# Patient Record
Sex: Male | Born: 1960 | Race: White | Hispanic: No | Smoking: Former smoker
Health system: Southern US, Community
[De-identification: ages and names within clinical notes are randomized; demographics above are authoritative.]

## PROBLEM LIST (undated history)

## (undated) DIAGNOSIS — K219 Gastro-esophageal reflux disease without esophagitis: Secondary | ICD-10-CM

## (undated) DIAGNOSIS — A09 Infectious gastroenteritis and colitis, unspecified: Secondary | ICD-10-CM

## (undated) HISTORY — DX: Gastro-esophageal reflux disease without esophagitis: K21.9

---

## 2008-10-22 ENCOUNTER — Emergency Department (HOSPITAL_COMMUNITY): Admission: EM | Admit: 2008-10-22 | Discharge: 2008-10-23 | Payer: Self-pay | Admitting: Emergency Medicine

## 2010-04-21 LAB — URINALYSIS, ROUTINE W REFLEX MICROSCOPIC
Ketones, ur: 15 mg/dL — AB
Nitrite: NEGATIVE
Protein, ur: NEGATIVE mg/dL

## 2010-04-21 LAB — COMPREHENSIVE METABOLIC PANEL
ALT: 171 U/L — ABNORMAL HIGH (ref 0–53)
Albumin: 3.2 g/dL — ABNORMAL LOW (ref 3.5–5.2)
Alkaline Phosphatase: 283 U/L — ABNORMAL HIGH (ref 39–117)
BUN: 10 mg/dL (ref 6–23)
CO2: 25 mEq/L (ref 19–32)
Calcium: 8.6 mg/dL (ref 8.4–10.5)
Chloride: 101 mEq/L (ref 96–112)
Creatinine, Ser: 1.1 mg/dL (ref 0.4–1.5)
GFR calc Af Amer: >60 mL/min (ref >60)
Potassium: 4 mEq/L (ref 3.5–5.1)
Sodium: 133 mEq/L — ABNORMAL LOW (ref 135–145)

## 2010-04-21 LAB — CBC
HCT: 36 % — ABNORMAL LOW (ref 39.0–52.0)
Hemoglobin: 12.8 g/dL — ABNORMAL LOW (ref 13.0–17.0)
MCV: 92.9 fL (ref 78.0–100.0)
Platelets: 152 10*3/uL (ref 150–400)
RDW: 12.6 % (ref 11.5–15.5)

## 2010-04-21 LAB — DIFFERENTIAL
Basophils Absolute: 0 10*3/uL (ref 0.0–0.1)
Basophils Relative: 0 % (ref 0–1)
Lymphocytes Relative: 8 % — ABNORMAL LOW (ref 12–46)
Monocytes Absolute: 0.7 10*3/uL (ref 0.1–1.0)
Neutrophils Relative %: 81 % — ABNORMAL HIGH (ref 43–77)

## 2010-04-21 LAB — LIPASE, BLOOD: Lipase: 61 U/L — ABNORMAL HIGH (ref 11–59)

## 2010-11-25 IMAGING — CT CT ABDOMEN W/ CM
2 of 5 series · 17 of 46 positions shown, 19 images · IV contrast (APPLIED)
Comparison: None available.

 CT ABDOMEN

10/23/2008 - DUPLICATE COPY for exam association in [facility] – No change from original report.
CLINICAL DATA: Epigastric pain, nausea and vomiting.

 CT ABDOMEN AND PELVIS WITH CONTRAST
TECHNIQUE: Multidetector CT imaging of the abdomen and pelvis was
 performed using the standard protocol following bolus
 administration of intravenous contrast.
 Contrast: 80 ml Smnipaque-BUU.

[Series 2: abd/pelv with 5.0 b31f st · axial · 0.64mm/px · z∈[+722,+1102]mm · 14 of 86 slices shown, 16 images]
[im 5/86  soft-tissue]
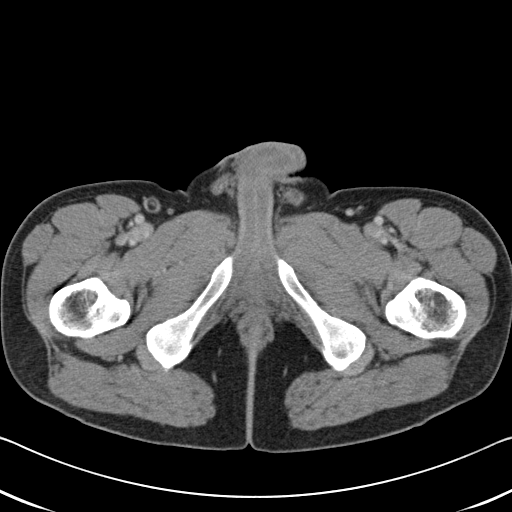
[im 5/86  bone]
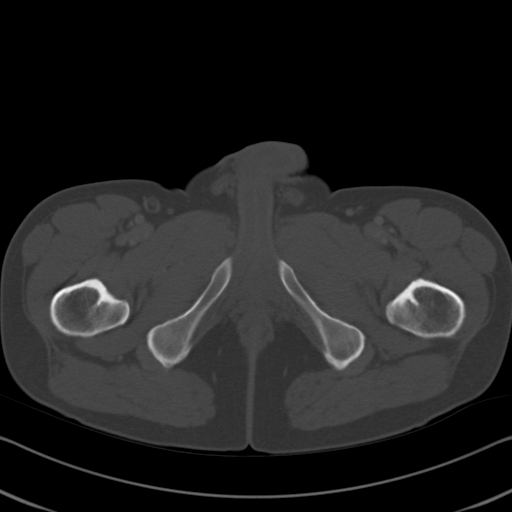
[im 10/86  soft-tissue]
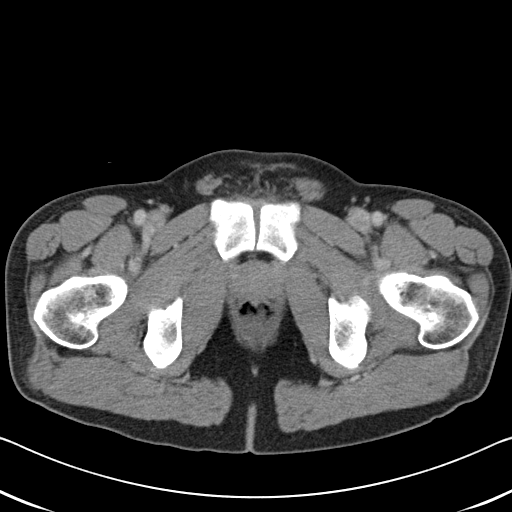
[im 19/86  soft-tissue]
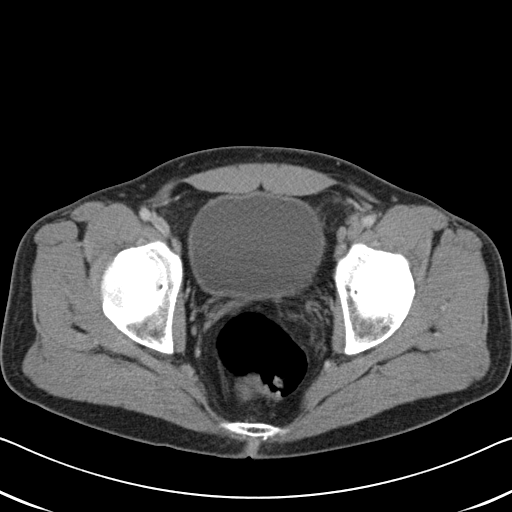
[im 24/86  soft-tissue]
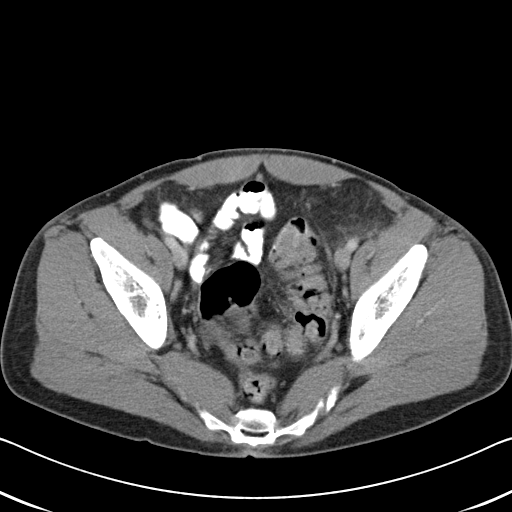
[im 29/86  soft-tissue]
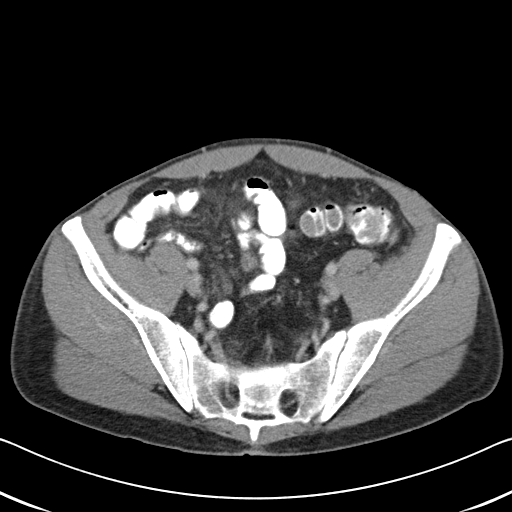
[im 34/86  soft-tissue]
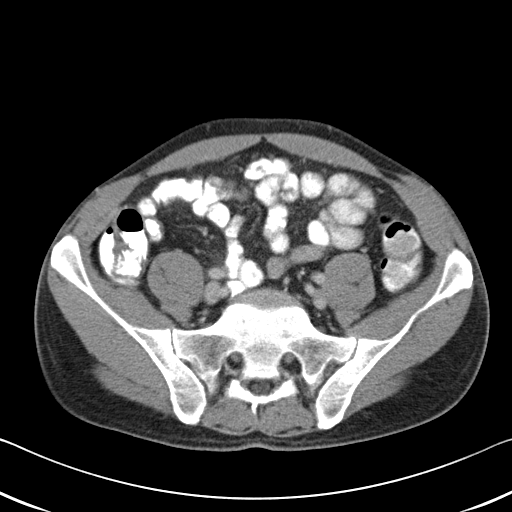
[im 38/86  soft-tissue]
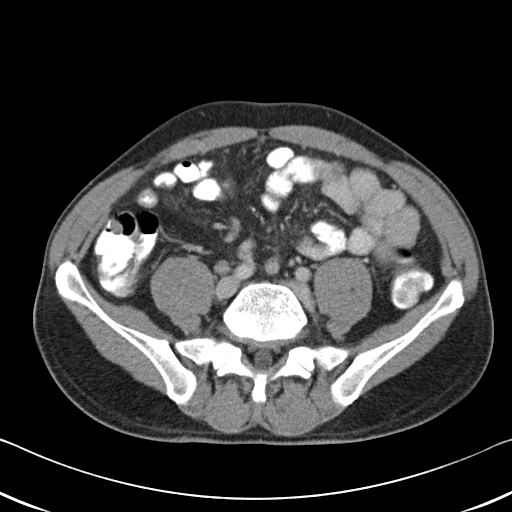
[im 48/86  soft-tissue]
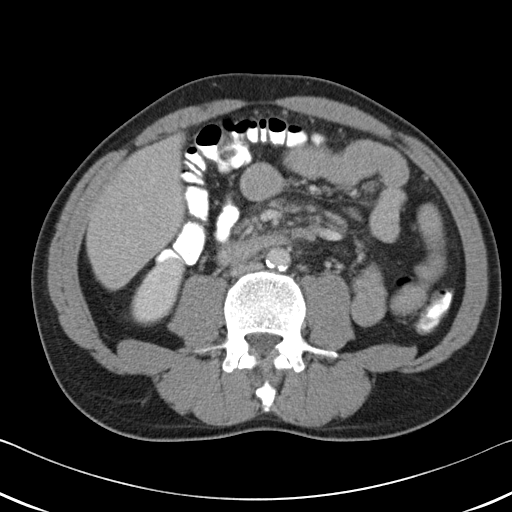
[im 52/86  soft-tissue]
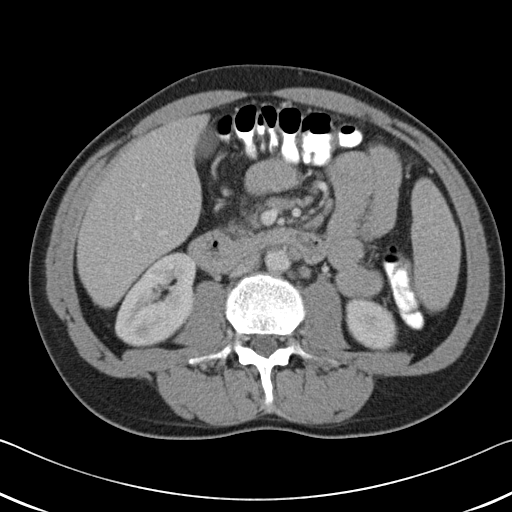
[im 52/86  bone]
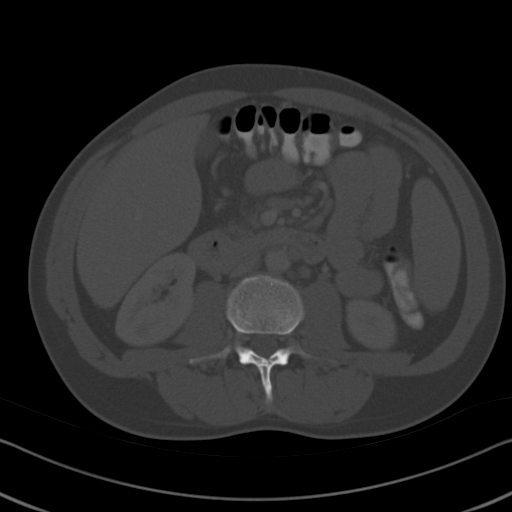
[im 57/86  soft-tissue]
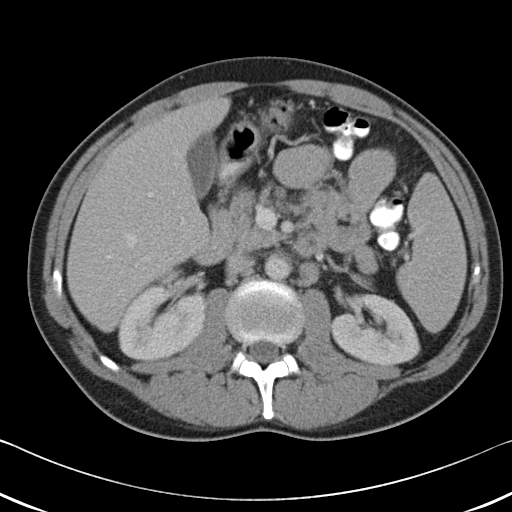
[im 62/86  soft-tissue]
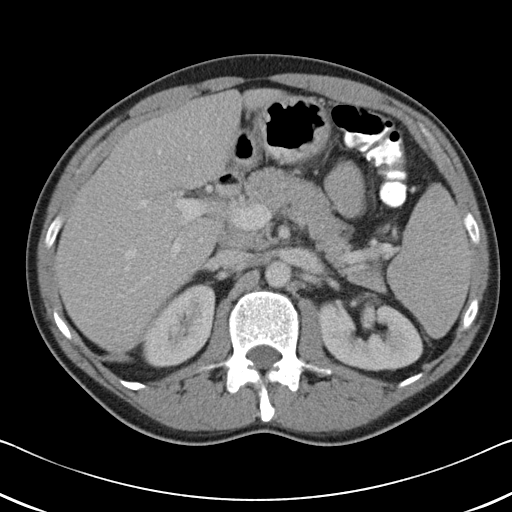
[im 67/86  soft-tissue]
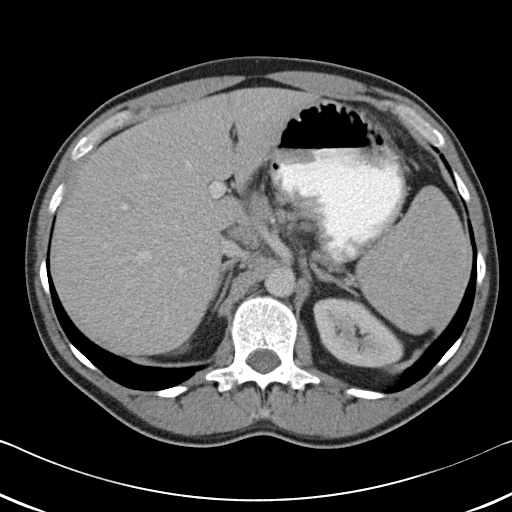
[im 76/86  soft-tissue]
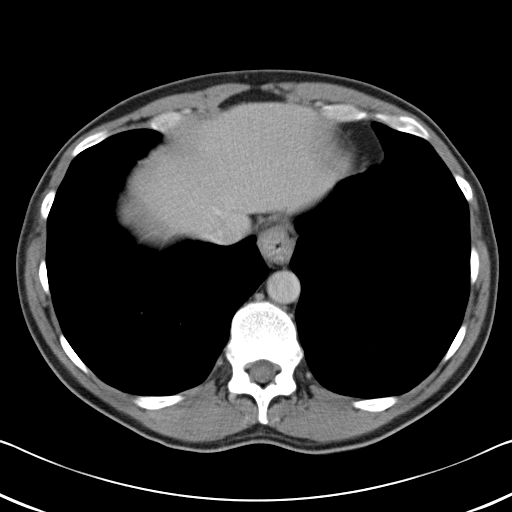
[im 81/86  soft-tissue]
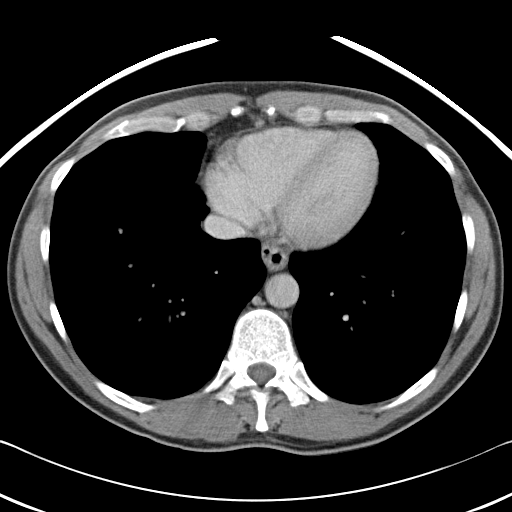

[Series 5: abd/pelv with 2.0 spo cor st · coronal · 0.85mm/px · 3 of 125 slices shown]
[im 42/125  soft-tissue]
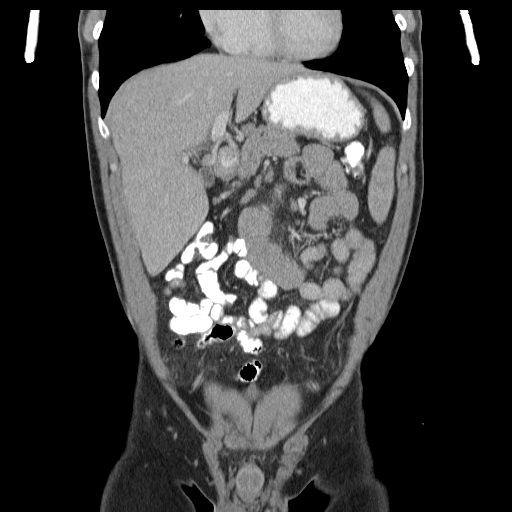
[im 56/125  soft-tissue]
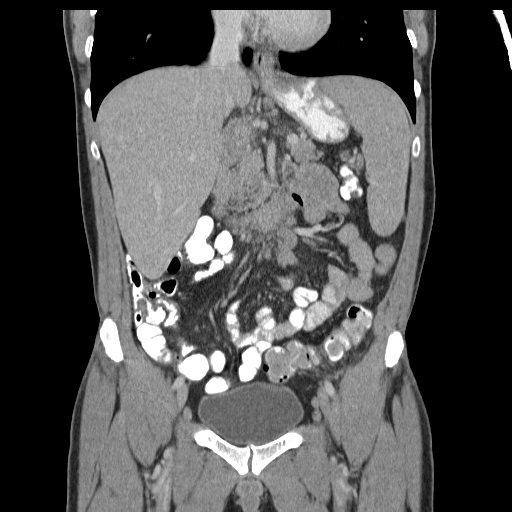
[im 69/125  soft-tissue]
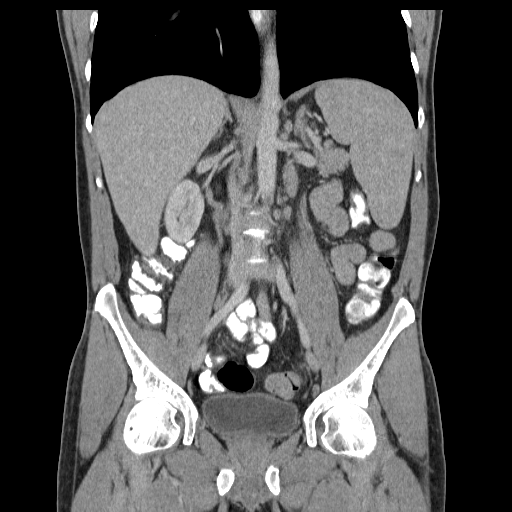

[17 of 46 positions shown; findings below may reference images not displayed]

FINDINGS: Lung base are clear. No pleural or pericardial
 effusion. Small hiatal hernia.

 The patient has retroperitoneal lymphadenopathy with a left
 periaortic node on image 30 measuring 1.2 cm. Gastrohepatic
 ligament lymph node on image 21 measures 1.4 cm. There is an
 abnormal number of lymph nodes also seen the root the mesentery
 with some haziness present. Index node on image 35 measures 0.7 cm
 short axis dimension. The gallbladder, liver, adrenal glands and
 kidneys appear normal. There is no focal splenic lesion. Splenic
 volume is 490 cm compatible with mild splenomegaly. No focal fluid
 collection. No focal bony abnormality.
IMPRESSION: 1. Abdominal lymphadenopathy and mild splenomegaly is nonspecific
 but could be due to lymphoproliferative disease/lymphoma.
 2. Small hiatal hernia.

 CT PELVIS
FINDINGS: There is some infiltration of fat about the sigmoid
 colon at its junction with the descending colon worrisome for
 diverticulitis. No abscess or perforation. There is a small
 volume of free pelvic fluid. The appendix is unremarkable.
 Urinary bladder, seminal vesicles and prostate gland appear normal.
 No focal bony abnormality.
IMPRESSION: 1. Infiltration of fat about the sigmoid colon at its junction
 with the descending colon is worrisome for diverticulitis. No
 abscess or perforation.
 2. Tiny amount of free pelvic fluid.

## 2011-01-26 ENCOUNTER — Encounter (HOSPITAL_COMMUNITY): Payer: Self-pay | Admitting: *Deleted

## 2011-01-26 ENCOUNTER — Emergency Department (HOSPITAL_COMMUNITY): Payer: Self-pay

## 2011-01-26 ENCOUNTER — Emergency Department (HOSPITAL_COMMUNITY)
Admission: EM | Admit: 2011-01-26 | Discharge: 2011-01-26 | Disposition: A | Discharge status: Home / Self Care | Payer: Self-pay | Attending: Cardiovascular Disease | Admitting: Cardiovascular Disease

## 2011-01-26 ENCOUNTER — Other Ambulatory Visit: Payer: Self-pay

## 2011-01-26 DIAGNOSIS — R0789 Other chest pain: Secondary | ICD-10-CM | POA: Insufficient documentation

## 2011-01-26 DIAGNOSIS — F17200 Nicotine dependence, unspecified, uncomplicated: Secondary | ICD-10-CM | POA: Insufficient documentation

## 2011-01-26 DIAGNOSIS — M25519 Pain in unspecified shoulder: Secondary | ICD-10-CM | POA: Insufficient documentation

## 2011-01-26 DIAGNOSIS — R079 Chest pain, unspecified: Secondary | ICD-10-CM

## 2011-01-26 DIAGNOSIS — M542 Cervicalgia: Secondary | ICD-10-CM | POA: Insufficient documentation

## 2011-01-26 HISTORY — DX: Infectious gastroenteritis and colitis, unspecified: A09

## 2011-01-26 LAB — CBC
MCV: 90.9 fL (ref 78.0–100.0)
Platelets: 207 10*3/uL (ref 150–400)
RBC: 4.61 MIL/uL (ref 4.22–5.81)
WBC: 12.3 10*3/uL — ABNORMAL HIGH (ref 4.0–10.5)

## 2011-01-26 LAB — DIFFERENTIAL
Basophils Absolute: 0 10*3/uL (ref 0.0–0.1)
Eosinophils Absolute: 0.4 10*3/uL (ref 0.0–0.7)
Eosinophils Relative: 3 % (ref 0–5)
Monocytes Absolute: 1.2 10*3/uL — ABNORMAL HIGH (ref 0.1–1.0)
Monocytes Relative: 10 % (ref 3–12)
Neutrophils Relative %: 72 % (ref 43–77)

## 2011-01-26 LAB — COMPREHENSIVE METABOLIC PANEL
ALT: 21 U/L (ref 0–53)
AST: 20 U/L (ref 0–37)
Alkaline Phosphatase: 68 U/L (ref 39–117)
BUN: 23 mg/dL (ref 6–23)
CO2: 26 mEq/L (ref 19–32)
GFR calc Af Amer: 74 mL/min — ABNORMAL LOW (ref >90)
GFR calc non Af Amer: 64 mL/min — ABNORMAL LOW (ref >90)
Glucose, Bld: 104 mg/dL — ABNORMAL HIGH (ref 70–99)
Total Protein: 7.6 g/dL (ref 6.0–8.3)

## 2011-01-26 LAB — CARDIAC PANEL(CRET KIN+CKTOT+MB+TROPI)
CK, MB: 1.9 ng/mL (ref 0.3–4.0)
CK, MB: 1.5 ng/mL (ref 0.3–4.0)
Relative Index: INVALID (ref 0.0–2.5)
Total CK: 94 U/L (ref 7–232)
Troponin I: <0.3 ng/mL (ref <0.30)

## 2011-01-26 MED ORDER — IBUPROFEN 200 MG PO TABS: 600.0000 mg | ORAL_TABLET | Freq: Three times a day (TID) | ORAL | Status: AC

## 2011-01-26 MED ORDER — ASPIRIN 81 MG PO CHEW
324.0000 mg | CHEWABLE_TABLET | Freq: Once | ORAL | Status: AC
  Administered 2011-01-26: 324 mg via ORAL
  Filled 2011-01-26: qty 4

## 2011-01-26 MED ORDER — NITROGLYCERIN 0.4 MG SL SUBL
0.4000 mg | SUBLINGUAL_TABLET | Freq: Once | SUBLINGUAL | Status: AC
  Administered 2011-01-26: 0.4 mg via SUBLINGUAL

## 2011-01-26 MED ORDER — MORPHINE SULFATE 2 MG/ML IJ SOLN
2.0000 mg | Freq: Once | INTRAMUSCULAR | Status: AC
  Administered 2011-01-26: 2 mg via INTRAVENOUS
  Filled 2011-01-26: qty 1

## 2011-01-26 MED ORDER — KETOROLAC TROMETHAMINE 15 MG/ML IJ SOLN
15.0000 mg | Freq: Once | INTRAMUSCULAR | Status: AC
  Administered 2011-01-26: 15 mg via INTRAVENOUS
  Filled 2011-01-26: qty 1

## 2011-01-26 MED ORDER — NITROGLYCERIN 0.4 MG SL SUBL
0.4000 mg | SUBLINGUAL_TABLET | Freq: Once | SUBLINGUAL | Status: AC
  Administered 2011-01-26: 0.4 mg via SUBLINGUAL
  Filled 2011-01-26: qty 25

## 2011-01-26 NOTE — ED Notes (Signed)
Informed pt and spouse that cardiology is coming to see them.

## 2011-01-26 NOTE — ED Notes (Signed)
[facility] MD at bedside.

## 2011-01-26 NOTE — H&P (Signed)
Wayne Carpenter is an 51 y.o. male.   Chief Complaint:  Chest pain HPI:  Patient is a 51 year old Caucasian male with a minimal past medical history which includes intestinal infection approximately 2-3 years ago, and positive tobacco use. The patient states that he developed a "stiffness" in his chest at approximately 3 to 4:00pm yesterday afternoon which was 4/10 in intensity. He states he could not catch his breath and his chest hurt with deep inspiration. Patient states he cannot lay down flat because of the increased pressure in his chest and had to sleep sitting in a chair.  He reports upon waking this morning that he had pain in the left-side of his neck and some numbness that radiated down the left arm.  He has no family history of premature heart attack or heart disease.  He's had minimal improvement and chest tightness with nitroglycerin. Now 3/10 in intensity.  Initial troponin is less than 0.30.   Past Medical History  Diagnosis Date  . Intestinal infection 2-3 years ago.    History reviewed. No pertinent past surgical history.  No family history on file. Social History:  reports that he has been smoking.  He does not have any smokeless tobacco history on file. He reports that he drinks alcohol. He reports that he does not use illicit drugs.  25PY smoking history.  He drinks a  12 pack of beer per week.  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  Allergies: No Known Allergies  Medications Prior to Admission  Medication Dose Route Frequency Provider Last Rate Last Dose  . aspirin chewable tablet 324 mg  324 mg Oral Once Donnetta Hutching, MD   324 mg at 01/26/11 0837  . morphine 2 MG/ML injection 2 mg  2 mg Intravenous Once Donnetta Hutching, MD   2 mg at 01/26/11 1252  . nitroGLYCERIN (NITROSTAT) SL tablet 0.4 mg  0.4 mg Sublingual Once Donnetta Hutching, MD   0.4 mg at 01/26/11 0839  . nitroGLYCERIN (NITROSTAT) SL tablet 0.4 mg  0.4 mg  Sublingual Once Donnetta Hutching, MD   0.4 mg at 01/26/11 1246   No current outpatient prescriptions on file as of 01/26/2011.    Results for orders placed during the hospital encounter of 01/26/11 (from the past 48 hour(s))  CBC     Status: Abnormal   Collection Time   01/26/11  8:10 AM      Component Value Range Comment   WBC 12.3 (*) 4.0 - 10.5 (K/uL)    RBC 4.61  4.22 - 5.81 (MIL/uL)    Hemoglobin 14.8  13.0 - 17.0 (g/dL)    HCT 16.1  09.6 - 04.5 (%)    MCV 90.9  78.0 - 100.0 (fL)    MCH 32.1  26.0 - 34.0 (pg)    MCHC 35.3  30.0 - 36.0 (g/dL)    RDW 40.9  81.1 - 91.4 (%)    Platelets 207  150 - 400 (K/uL)   DIFFERENTIAL     Status: Abnormal   Collection Time   01/26/11  8:10 AM      Component Value Range Comment   Neutrophils Relative 72  43 - 77 (%)    Neutro Abs 8.8 (*) 1.7 - 7.7 (K/uL)    Lymphocytes Relative 15  12 - 46 (%)    Lymphs Abs 1.9  0.7 - 4.0 (K/uL)    Monocytes Relative 10  3 - 12 (%)    Monocytes Absolute 1.2 (*) 0.1 - 1.0 (K/uL)    Eosinophils Relative 3  0 - 5 (%)    Eosinophils Absolute 0.4  0.0 - 0.7 (K/uL)    Basophils Relative 0  0 - 1 (%)    Basophils Absolute 0.0  0.0 - 0.1 (K/uL)   COMPREHENSIVE METABOLIC PANEL     Status: Abnormal   Collection Time   01/26/11  8:10 AM      Component Value Range Comment   Sodium 136  135 - 145 (mEq/L)    Potassium 4.4  3.5 - 5.1 (mEq/L)    Chloride 102  96 - 112 (mEq/L)    CO2 26  19 - 32 (mEq/L)    Glucose, Bld 104 (*) 70 - 99 (mg/dL)    BUN 23  6 - 23 (mg/dL)    Creatinine, Ser 1.61  0.50 - 1.35 (mg/dL)    Calcium 9.5  8.4 - 10.5 (mg/dL)    Total Protein 7.6  6.0 - 8.3 (g/dL)    Albumin 4.1  3.5 - 5.2 (g/dL)    AST 20  0 - 37 (U/L)    ALT 21  0 - 53 (U/L)    Alkaline Phosphatase 68  39 - 117 (U/L)    Total Bilirubin 0.4  0.3 - 1.2 (mg/dL)    GFR calc non Af Amer 64 (*) >90 (mL/min)    GFR calc Af Amer 74 (*) >90 (mL/min)   CARDIAC PANEL(CRET KIN+CKTOT+MB+TROPI)     Status: Normal   Collection Time   01/26/11   8:10 AM      Component Value Range Comment   Total CK 130  7 - 232 (U/L)    CK, MB 1.9  0.3 - 4.0 (ng/mL)    Troponin I <0.30  <0.30 (ng/mL)    Relative Index 1.5  0.0 - 2.5     Dg Chest Port 1 View  01/26/2011  *RADIOLOGY REPORT*  Clinical Data: Mid chest pain.  One pack per day smoker.  PORTABLE CHEST - 1 VIEW  Comparison: None.  Findings: Midline trachea.  Normal heart size and mediastinal contours. No pleural effusion or pneumothorax.  Minimal biapical pleural thickening. Clear lungs.  IMPRESSION: No acute cardiopulmonary disease.  Original Report Authenticated By: Consuello Bossier, M.D.    Review of Systems  Constitutional: Negative for fever and diaphoresis.  HENT: Positive for neck pain. Negative for congestion.        Headache started after nitroglycerin.  Eyes: Positive for discharge. Negative for blurred vision and double vision.  Respiratory: Positive for shortness of breath. Negative for cough and wheezing.   Cardiovascular: Positive for chest pain and orthopnea. Negative for palpitations, leg swelling and PND.       "stiffness" 4/10  Gastrointestinal: Negative for nausea, vomiting, abdominal pain, diarrhea, constipation, blood in stool and melena.  Genitourinary: Negative for dysuria and hematuria.  Musculoskeletal:       Right sided neck pain.    Neurological: Positive for headaches. Negative for dizziness and weakness.  All other systems reviewed and are negative.    Blood pressure 117/77, pulse 86, temperature 98.4 F (36.9 C), resp. rate 20, height 5\' 6"  (1.676 m), weight 65.772 kg (145 lb), SpO2 99.00%. Physical Exam  Constitutional: He is oriented to person, place, and time. He appears well-developed and well-nourished. No distress.  HENT:  Head: Normocephalic and atraumatic.  Eyes: EOM are normal. Pupils are equal, round, and reactive  to light. No scleral icterus.  Neck: Normal range of motion. No JVD present.  Cardiovascular: Normal rate and regular rhythm.     No murmur heard. Respiratory: Effort normal. He has no wheezes. He has no rales. He exhibits no tenderness.       BS appear decreased.  GI: Soft. Bowel sounds are normal. He exhibits no distension. There is no tenderness.  Musculoskeletal: He exhibits no edema.       Mild exacerbation of neck pain with rotation.  Neurological: He is alert and oriented to person, place, and time. He exhibits normal muscle tone.  Skin: Skin is warm and dry.  Psychiatric: He has a normal mood and affect.     Assessment/Plan Patient Active Hospital Problem List: Chest pain (01/26/2011) Leukocytosis  Plan:  51 year-old male whose risk factors include tobacco use for the last 25 years.  He presents with chest stiffness since yesterday, worse with inspiration.  Slightly elevated WBCs.  He has a somewhat mixed presentation of typical and atypical features. He has pain in the left side of his neck and arm which didn't start until this morning. However, chest tightness started yesterday.  CXR shows no acute processes.  Initial cardiac enzymes are negative EKG shows no acute changes. I will repeat cardiac enzymes.  I will give IV toradol.  The patient maybe discharged home with OP NST.    HAGER,BRYAN W 01/26/2011, 3:21 PM      Patient seen and examined. Agree with assessment and plan. 51 yo WM without prior cardiac hx.  Sx complex is atypical and suggests possible musculoskeletal etiology vs pleuritic. Cardiac enzymes neg x2. ECG without acute changes.  Will give toradol. Plan for probable dc home with out pt exercise myoview and echo study in office next week and f/u with me in clinic.   Lennette Bihari, MD, [facility] 01/26/2011 4:27 PM

## 2011-01-26 NOTE — H&P (Signed)
See signed note of 01/26/11 at 4:29 pm Mikki Ziff A

## 2011-01-26 NOTE — ED Notes (Signed)
Awaiting review of labs by EDP and further orders.

## 2011-01-26 NOTE — H&P (Signed)
Wayne Carpenter is an 51 y.o. male.   Chief Complaint:  Chest pain HPI:  Patient is a 51 year old Caucasian male with a minimal past medical history which includes intestinal infection approximately 2-3 years ago, and positive tobacco use. The patient states that he developed a "stiffness" in his chest at approximately 3 to 4:00pm yesterday afternoon which was 4/10 in intensity. He states he could not catch his breath and his chest hurt with deep inspiration. Patient states he cannot lay down flat because of the increased pressure in his chest and had to sleep sitting in a chair.  He reports upon waking this morning that he had pain in the left-side of his neck and some numbness that radiated down the left arm.  He has no family history of premature heart attack or heart disease.  He's had minimal improvement and chest tightness with nitroglycerin. Now 3/10 in intensity.  Initial troponin is less than 0.30.   Past Medical History  Diagnosis Date  . Intestinal infection 2-3 years ago.    History reviewed. No pertinent past surgical history.  No family history on file. Social History:  reports that he has been smoking.  He does not have any smokeless tobacco history on file. He reports that he drinks alcohol. He reports that he does not use illicit drugs.  25PY smoking history.  He drinks a  12 pack of beer per week.  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  Allergies: No Known Allergies  Medications Prior to Admission  Medication Dose Route Frequency Provider Last Rate Last Dose  . aspirin chewable tablet 324 mg  324 mg Oral Once Donnetta Hutching, MD   324 mg at 01/26/11 0837  . morphine 2 MG/ML injection 2 mg  2 mg Intravenous Once Donnetta Hutching, MD   2 mg at 01/26/11 1252  . nitroGLYCERIN (NITROSTAT) SL tablet 0.4 mg  0.4 mg Sublingual Once Donnetta Hutching, MD   0.4 mg at 01/26/11 0839  . nitroGLYCERIN (NITROSTAT) SL tablet 0.4 mg  0.4 mg  Sublingual Once Donnetta Hutching, MD   0.4 mg at 01/26/11 1246   No current outpatient prescriptions on file as of 01/26/2011.    Results for orders placed during the hospital encounter of 01/26/11 (from the past 48 hour(s))  CBC     Status: Abnormal   Collection Time   01/26/11  8:10 AM      Component Value Range Comment   WBC 12.3 (*) 4.0 - 10.5 (K/uL)    RBC 4.61  4.22 - 5.81 (MIL/uL)    Hemoglobin 14.8  13.0 - 17.0 (g/dL)    HCT 16.1  09.6 - 04.5 (%)    MCV 90.9  78.0 - 100.0 (fL)    MCH 32.1  26.0 - 34.0 (pg)    MCHC 35.3  30.0 - 36.0 (g/dL)    RDW 40.9  81.1 - 91.4 (%)    Platelets 207  150 - 400 (K/uL)   DIFFERENTIAL     Status: Abnormal   Collection Time   01/26/11  8:10 AM      Component Value Range Comment   Neutrophils Relative 72  43 - 77 (%)    Neutro Abs 8.8 (*) 1.7 - 7.7 (K/uL)    Lymphocytes Relative 15  12 - 46 (%)    Lymphs Abs 1.9  0.7 - 4.0 (K/uL)    Monocytes Relative 10  3 - 12 (%)    Monocytes Absolute 1.2 (*) 0.1 - 1.0 (K/uL)    Eosinophils Relative 3  0 - 5 (%)    Eosinophils Absolute 0.4  0.0 - 0.7 (K/uL)    Basophils Relative 0  0 - 1 (%)    Basophils Absolute 0.0  0.0 - 0.1 (K/uL)   COMPREHENSIVE METABOLIC PANEL     Status: Abnormal   Collection Time   01/26/11  8:10 AM      Component Value Range Comment   Sodium 136  135 - 145 (mEq/L)    Potassium 4.4  3.5 - 5.1 (mEq/L)    Chloride 102  96 - 112 (mEq/L)    CO2 26  19 - 32 (mEq/L)    Glucose, Bld 104 (*) 70 - 99 (mg/dL)    BUN 23  6 - 23 (mg/dL)    Creatinine, Ser 1.61  0.50 - 1.35 (mg/dL)    Calcium 9.5  8.4 - 10.5 (mg/dL)    Total Protein 7.6  6.0 - 8.3 (g/dL)    Albumin 4.1  3.5 - 5.2 (g/dL)    AST 20  0 - 37 (U/L)    ALT 21  0 - 53 (U/L)    Alkaline Phosphatase 68  39 - 117 (U/L)    Total Bilirubin 0.4  0.3 - 1.2 (mg/dL)    GFR calc non Af Amer 64 (*) >90 (mL/min)    GFR calc Af Amer 74 (*) >90 (mL/min)   CARDIAC PANEL(CRET KIN+CKTOT+MB+TROPI)     Status: Normal   Collection Time   01/26/11   8:10 AM      Component Value Range Comment   Total CK 130  7 - 232 (U/L)    CK, MB 1.9  0.3 - 4.0 (ng/mL)    Troponin I <0.30  <0.30 (ng/mL)    Relative Index 1.5  0.0 - 2.5     Dg Chest Port 1 View  01/26/2011  *RADIOLOGY REPORT*  Clinical Data: Mid chest pain.  One pack per day smoker.  PORTABLE CHEST - 1 VIEW  Comparison: None.  Findings: Midline trachea.  Normal heart size and mediastinal contours. No pleural effusion or pneumothorax.  Minimal biapical pleural thickening. Clear lungs.  IMPRESSION: No acute cardiopulmonary disease.  Original Report Authenticated By: Consuello Bossier, M.D.    Review of Systems  Constitutional: Negative for fever and diaphoresis.  HENT: Positive for neck pain. Negative for congestion.        Headache started after nitroglycerin.  Eyes: Negative for blurred vision and double vision.  Respiratory: Positive for shortness of breath. Negative for cough and wheezing.   Cardiovascular: Positive for chest pain and orthopnea. Negative for palpitations, leg swelling and PND.       "stiffness" 4/10  Gastrointestinal: Negative for nausea, vomiting, abdominal pain, diarrhea, constipation, blood in stool and melena.  Genitourinary: Negative for dysuria and hematuria.  Musculoskeletal:       Right sided neck pain.    Neurological: Positive for headaches. Negative for dizziness and weakness.  All other systems reviewed and are negative.    Blood pressure 117/77, pulse 86, temperature 98.4 F (36.9 C), resp. rate 20, height 5\' 6"  (1.676 m), weight 65.772 kg (145 lb), SpO2 99.00%. Physical Exam  Constitutional: He is oriented to person, place, and time. He appears well-developed and well-nourished. No distress.  HENT:  Head: Normocephalic and atraumatic.  Eyes: EOM are normal. Pupils are equal, round, and reactive to light. No  scleral icterus.  Neck: Normal range of motion. No JVD present.  Cardiovascular: Normal rate and regular rhythm.   No murmur  heard. Respiratory: Effort normal. He has no wheezes. He has no rales. He exhibits no tenderness.       BS appear decreased.  GI: Soft. Bowel sounds are normal. He exhibits no distension. There is no tenderness.  Musculoskeletal: He exhibits no edema.       Mild exacerbation of neck pain with rotation.  Neurological: He is alert and oriented to person, place, and time. He exhibits normal muscle tone.  Skin: Skin is warm and dry.  Psychiatric: He has a normal mood and affect.     Assessment/Plan Patient Active Hospital Problem List: Chest pain (01/26/2011) Leukocytosis  Plan:  51 year-old male whose risk factors include tobacco use for the last 25 years.  He presents with chest stiffness since yesterday, worse with inspiration.  Slightly elevated WBCs.  He has a somewhat mixed presentation of typical and atypical features. He has pain in the left side of his neck and arm which didn't start until this morning. However, chest tightness started yesterday.  CXR shows no acute processes.  Initial cardiac enzymes are negative EKG shows no acute changes. I will repeat cardiac enzymes.  I will give IV toradol.  The patient maybe discharged home with OP NST.    Keron Neenan W 01/26/2011, 3:21 PM

## 2011-01-26 NOTE — ED Notes (Signed)
Spoke with Nada Boozer, [facility] concerning pt due to consult being called at 1030 this am. She sts they are [facility] "performing admissions and discharges, and cardiac caths." Sts there "is someone on their way." Pt and spouse informed. Pt is frustrated because he was supposed to leaving for a fishing trip today. Spouse voices understanding and agreement to plan of care. Pt and spouse agree to stay and wait at this time.

## 2011-01-26 NOTE — ED Notes (Signed)
Pt states he started to have generalized chest pain. Pt states pain started yesterday around 3pm. Pt states pain increase over in the morning. Pt states he numbness on the left side of his neck down left arm. Pt c/o sob

## 2011-01-26 NOTE — ED Notes (Signed)
MD with [facility] returned page and stated he would call RN back in a few minutes after looking at pts chart to see what needed to be done for d/c.

## 2011-01-26 NOTE — ED Notes (Signed)
Pt denies n/v. Pt states he also has a headache

## 2011-01-26 NOTE — ED Notes (Signed)
Pt given toradol per order. D/c orders not placed by consulting provider prior to him leaving. Tresa Endo, MD has been paged for d/c orders.

## 2011-01-26 NOTE — ED Provider Notes (Signed)
History     CSN: [id number]  Arrival date & time 01/26/11  4098   First MD Initiated Contact with Patient 01/26/11 [id number]      Chief Complaint  Patient presents with  . Chest Pain    (Consider location/radiation/quality/duration/timing/severity/associated sxs/prior treatment) HPI...  chest pain since 3:00 yesterday afternoon. Described as tightness. Radiates to left neck and left shoulder. No shortness of breath, nausea, diaphoresis. Patient smokes one pack per day. No family history of cardiac disease. Has tried nothing for the pain. Nothing makes it better or  History reviewed. No pertinent past medical history.  History reviewed. No pertinent past surgical history.  No family history on file.  History  Substance Use Topics  . Smoking status: Current Everyday Smoker  . Smokeless tobacco: Not on file  . Alcohol Use: Yes     everyday drink      Review of Systems  All other systems reviewed and are negative.    Allergies  Review of patient's allergies indicates no known allergies.  Home Medications  No current outpatient prescriptions on file.  BP 128/79  Pulse 80  Temp 98.2 F (36.8 C)  Resp 20  Ht 5\' 6"  (1.676 m)  Wt 145 lb (65.772 kg)  BMI 23.40 kg/m2  SpO2 96%  Physical Exam  Nursing note and vitals reviewed. Constitutional: He is oriented to person, place, and time. He appears well-developed and well-nourished.  HENT:  Head: Normocephalic and atraumatic.  Eyes: Conjunctivae and EOM are normal. Pupils are equal, round, and reactive to light.  Neck: Normal range of motion. Neck supple.  Cardiovascular: Normal rate and regular rhythm.   Pulmonary/Chest: Effort normal and breath sounds normal.  Abdominal: Soft. Bowel sounds are normal.  Musculoskeletal: Normal range of motion.  Neurological: He is alert and oriented to person, place, and time.  Skin: Skin is warm and dry.  Psychiatric: He has a normal mood and affect.    ED Course  Procedures  (including critical care time)  Labs Reviewed - No data to display No results found.   No diagnosis found.  Date: 01/26/2011  Rate: 77  Rhythm: normal sinus rhythm  QRS Axis: normal  Intervals: normal  ST/T Wave abnormalities: normal  Conduction Disutrbances:none  Narrative Interpretation:   Old EKG Reviewed: unchanged    MDM  Patient has minimal risk factor profile. History is somewhat gray. Will do screening tests. Rx aspirin and nitroglycerin.        Donnetta Hutching, MD 01/26/11 [phone]

## 2011-01-26 NOTE — ED Notes (Signed)
Pt reports dull aching constant chest pain starting yesterday. Also reports shob associated, that is constant. 99% on RA. Was painting cabinets when pain started. Also has had cough x2 weeks. Pain worse with deep breath. No Hx of resp or cardiac problems. No family Hx of cardiac problems. Also began having numbness down L neck and L arm this am, sts more of an "irritation than a pain."

## 2018-03-25 ENCOUNTER — Encounter: Payer: Self-pay | Admitting: Family Medicine

## 2018-03-25 ENCOUNTER — Ambulatory Visit (INDEPENDENT_AMBULATORY_CARE_PROVIDER_SITE_OTHER): Payer: BLUE CROSS/BLUE SHIELD | Admitting: Family Medicine

## 2018-03-25 ENCOUNTER — Other Ambulatory Visit: Payer: Self-pay

## 2018-03-25 VITALS — BP 121/81 | HR 78 | Temp 98.5°F | Resp 16 | Ht 68.0 in | Wt 155.0 lb

## 2018-03-25 DIAGNOSIS — M79642 Pain in left hand: Secondary | ICD-10-CM | POA: Diagnosis not present

## 2018-03-25 DIAGNOSIS — Z23 Encounter for immunization: Secondary | ICD-10-CM

## 2018-03-25 DIAGNOSIS — G4700 Insomnia, unspecified: Secondary | ICD-10-CM | POA: Diagnosis not present

## 2018-03-25 DIAGNOSIS — H9193 Unspecified hearing loss, bilateral: Secondary | ICD-10-CM

## 2018-03-25 DIAGNOSIS — L821 Other seborrheic keratosis: Secondary | ICD-10-CM

## 2018-03-25 DIAGNOSIS — H9313 Tinnitus, bilateral: Secondary | ICD-10-CM

## 2018-03-25 DIAGNOSIS — M79641 Pain in right hand: Secondary | ICD-10-CM

## 2018-03-25 MED ORDER — TRAZODONE HCL 50 MG PO TABS: 25.0000 mg | ORAL_TABLET | Freq: Every evening | ORAL | 3 refills | Status: DC | PRN

## 2018-03-25 NOTE — Progress Notes (Signed)
   Subjective:    Patient ID: Wayne Carpenter, male    DOB: 03-02-1960, 58 y.o.   MRN: [id number]  HPI New to establish.  No recent PCP.  Hand pain- bilateral, L>R (L hand dominant).  Intermittently has sensation of swelling/tightness.  sxs started 'a few years ago'.  Pain will worsen as day goes on and w/ repetitive use.  Improves w/ ibuprofen.  Aunt w/ RA.  Tinnitus- bilateral.  Has been ongoing for 'a number of years'.  Difficulty sleeping.  No associated dizziness.  More noticeable 'when i'm settled'.  Partner has reported increased hearing difficulty.  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  'spot on back'- pt reports 'it looks like a mole' but 'has an irritation'.  Wants to make sure it's not skin cancer  Trouble sleeping- trouble both falling and staying asleep.  Has tried OTC sleep aid from [employer].  Will initially be effective but then no longer work.  Has been off all meds for 6-8 weeks.  Overdue for Tdap.   Review of Systems For ROS see HPI     Objective:   Physical Exam Constitutional:      General: He is not in acute distress.    Appearance: He is well-developed.  HENT:     Head: Normocephalic and atraumatic.     Right Ear: Tympanic membrane and ear canal normal.     Left Ear: Tympanic membrane and ear canal normal.  Eyes:     Conjunctiva/sclera: Conjunctivae normal.     Pupils: Pupils are equal, round, and reactive to light.  Neck:     Musculoskeletal: Normal range of motion and neck supple.     Thyroid: No thyromegaly.  Cardiovascular:     Rate and Rhythm: Normal rate and regular rhythm.     Heart sounds: Normal heart sounds. No murmur.  Pulmonary:     Effort: Pulmonary effort is normal. No respiratory distress.     Breath sounds: Normal breath sounds.  Abdominal:     General: Bowel sounds are normal. There is no distension.     Palpations: Abdomen is soft.  Musculoskeletal: Normal range of motion.        General: No swelling, tenderness  or deformity.     Comments: No deformity of either hand- MCP, PIP, DIP joints  Lymphadenopathy:     Cervical: No cervical adenopathy.  Skin:    General: Skin is warm and dry.     Comments: Keratosis on R upper back  Neurological:     Mental Status: He is alert and oriented to person, place, and time.     Cranial Nerves: No cranial nerve deficit.  Psychiatric:        Behavior: Behavior normal.           Assessment & Plan:  SK- new.  Reassured pt that this is benign.  Bilateral hand pain- new.  Appears consistent w/ OA based on worsening throughout the day and w/ repetitive movement.  But since sxs are bilateral, will check Rheumatoid Factor and ANA.  Continue NSAIDs prn.  Reviewed supportive care and red flags that should prompt return.  Pt expressed understanding and is in agreement w/ plan.   Tinnitus/hearing loss- new to provider, ongoing for pt.  No dizziness.  Refer to ENT for complete evaluation.  Pt expressed understanding and is in agreement w/ plan.

## 2018-03-25 NOTE — Patient Instructions (Addendum)
Follow up in 4-6 weeks to recheck sleep We'll notify you of your lab results and make any changes if needed Continue to use Ibuprofen as needed for hand pain We'll call you with your ENT appt START the Trazodone- 1/2 tab nightly- for sleep.  Increase to 1 tab as needed The spot on your back is totally fine!  Renee Rival! Call with any questions or concerns Welcome!  We're glad to have you!!!

## 2018-03-27 LAB — RHEUMATOID FACTOR: Rheumatoid fact SerPl-aCnc: <14 IU/mL (ref <14)

## 2018-03-27 LAB — ANA: ANA: NEGATIVE

## 2018-03-27 NOTE — Assessment & Plan Note (Signed)
New to provider, ongoing for pt.  Start Trazodone and monitor for improvement.

## 2018-05-06 ENCOUNTER — Ambulatory Visit: Payer: BLUE CROSS/BLUE SHIELD | Admitting: Family Medicine

## 2018-08-19 ENCOUNTER — Other Ambulatory Visit: Payer: Self-pay | Admitting: Family Medicine

## 2018-08-23 ENCOUNTER — Other Ambulatory Visit: Payer: Self-pay | Admitting: *Deleted

## 2018-08-23 DIAGNOSIS — Z20822 Contact with and (suspected) exposure to covid-19: Secondary | ICD-10-CM

## 2018-08-26 LAB — NOVEL CORONAVIRUS, NAA

## 2018-08-30 ENCOUNTER — Telehealth: Payer: Self-pay | Admitting: *Deleted

## 2018-08-30 ENCOUNTER — Other Ambulatory Visit: Payer: Self-pay

## 2018-08-30 DIAGNOSIS — Z20822 Contact with and (suspected) exposure to covid-19: Secondary | ICD-10-CM

## 2018-08-30 NOTE — Telephone Encounter (Signed)
Pt called and notified that test performed on 08/23/18 was insufficient and he would need to be retested. Pt verbalized understanding. Pt states he will try to make it to the [facility] testing site today. Order for testing placed.

## 2018-08-30 NOTE — Telephone Encounter (Signed)
-----   Message from Wayne Luna, MD sent at 08/30/2018  9:39 AM EDT ----- Regarding: test insufficient quantity I cannot tell that patient was notified of insufficient test/need to retest (may have occurred but not documented)--Thanks

## 2018-08-31 LAB — NOVEL CORONAVIRUS, NAA: SARS-CoV-2, NAA: NOT DETECTED

## 2020-07-14 ENCOUNTER — Encounter: Payer: Self-pay | Admitting: *Deleted

## 2022-03-22 ENCOUNTER — Ambulatory Visit: Payer: Medicaid Other | Admitting: Family Medicine

## 2022-03-22 ENCOUNTER — Encounter: Payer: Self-pay | Admitting: Family Medicine

## 2022-03-22 VITALS — BP 130/78 | HR 75 | Temp 98.6°F | Resp 17 | Ht 68.0 in | Wt 150.2 lb

## 2022-03-22 DIAGNOSIS — M1A9XX0 Chronic gout, unspecified, without tophus (tophi): Secondary | ICD-10-CM | POA: Diagnosis not present

## 2022-03-22 DIAGNOSIS — H9313 Tinnitus, bilateral: Secondary | ICD-10-CM | POA: Insufficient documentation

## 2022-03-22 DIAGNOSIS — G4700 Insomnia, unspecified: Secondary | ICD-10-CM | POA: Diagnosis not present

## 2022-03-22 DIAGNOSIS — M19049 Primary osteoarthritis, unspecified hand: Secondary | ICD-10-CM | POA: Insufficient documentation

## 2022-03-22 DIAGNOSIS — G5712 Meralgia paresthetica, left lower limb: Secondary | ICD-10-CM | POA: Insufficient documentation

## 2022-03-22 LAB — BASIC METABOLIC PANEL
BUN: 21 mg/dL (ref 6–23)
CO2: 26 mEq/L (ref 19–32)
Calcium: 9.5 mg/dL (ref 8.4–10.5)
Chloride: 103 mEq/L (ref 96–112)
Creatinine, Ser: 1.77 mg/dL — ABNORMAL HIGH (ref 0.40–1.50)
GFR: 41.44 mL/min — ABNORMAL LOW (ref >60.00)
Glucose, Bld: 93 mg/dL (ref 70–99)
Potassium: 4.7 mEq/L (ref 3.5–5.1)
Sodium: 138 mEq/L (ref 135–145)

## 2022-03-22 LAB — HEPATIC FUNCTION PANEL
ALT: 29 U/L (ref 0–53)
AST: 32 U/L (ref 0–37)
Albumin: 4.1 g/dL (ref 3.5–5.2)
Alkaline Phosphatase: 77 U/L (ref 39–117)
Bilirubin, Direct: 0.1 mg/dL (ref 0.0–0.3)
Total Bilirubin: 0.6 mg/dL (ref 0.2–1.2)
Total Protein: 7.4 g/dL (ref 6.0–8.3)

## 2022-03-22 LAB — URIC ACID: Uric Acid, Serum: 8.5 mg/dL — ABNORMAL HIGH (ref 4.0–7.8)

## 2022-03-22 MED ORDER — COLCHICINE 0.6 MG PO TABS: ORAL_TABLET | ORAL | 0 refills | Status: DC

## 2022-03-22 MED ORDER — CELECOXIB 100 MG PO CAPS: 100.0000 mg | ORAL_CAPSULE | Freq: Every day | ORAL | 1 refills | Status: DC

## 2022-03-22 NOTE — Patient Instructions (Addendum)
Schedule your complete physical in 6 months (today is just to get things back on track!) We'll notify you of your lab results and make any changes if needed START the Celebrex (Celecoxib) once daily w/ food to improve arthritis pain/inflammation DO NOT take any additional ibuprofen while on the Celebrex.  You can add Tylenol (Acetaminophen) as needed Try to avoid carrying things on your L hip to avoid pinching that nerve It's ok to use OTC sleep aids at night.  If this is not working, we can always prescribe something in the future If the ringing in your ears changes or worsens, please let me know and we can refer to ENT for a complete evaluation When things are quiet at night and the ringing is bothersome, try a white noise machine to improve symptoms Call with any questions or concerns Stay Safe!  Stay Healthy! Welcome Back!

## 2022-03-22 NOTE — Progress Notes (Unsigned)
   Subjective:    Patient ID: Wayne Carpenter, male    DOB: [birth year], 62 y.o.   MRN: [id number]  HPI Here today to re-establish care.  Last seen 2020.    L great toe pain- pt reports 3-4 episodes in the last 6 months.  Seemed related to increased consumption of red meat or shellfish.  Takes ~1 week to resolve.  Pt is not interested in daily medication.  Toe will get red, swollen, warm.  L thigh numbness- occurs w/ prolonged standing.  Numbness improves w/ sitting- resolves w/in 5 minutes.  Carries large pocket knife on that hip.  Bilateral hand pain- L>R, pt is L handed.  Pain is 'steady all the time'.  Takes ibuprofen as needed w/ 'fairly good' relief.  Pain is most notable w/ gripping.  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  Insomnia- pt has a hard time falling asleep and will also wake '10-15x during the night'.  No relief w/ Lipoflavin or OTC treatments.  Took Trazodone for 2 weeks but did not like how it made him feel.  Dreams were very intense.  Taking OTC sleep tab w/ some relief.    Ringing in ears- bilateral.  Sxs are more bothersome when things are quiet or at night.  Review of Systems For ROS see HPI     Objective:   Physical Exam Vitals reviewed.  Constitutional:      General: He is not in acute distress.    Appearance: Normal appearance. He is well-developed. He is not ill-appearing.  HENT:     Head: Normocephalic and atraumatic.     Right Ear: Tympanic membrane and ear canal normal.     Left Ear: Tympanic membrane and ear canal normal.  Eyes:     Extraocular Movements: Extraocular movements intact.     Conjunctiva/sclera: Conjunctivae normal.     Pupils: Pupils are equal, round, and reactive to light.  Neck:     Thyroid: No thyromegaly.  Cardiovascular:     Rate and Rhythm: Normal rate and regular rhythm.     Pulses: Normal pulses.     Heart sounds: Normal heart sounds. No murmur heard. Pulmonary:     Effort: Pulmonary effort is normal. No  respiratory distress.     Breath sounds: Normal breath sounds.  Abdominal:     General: Bowel sounds are normal. There is no distension.     Palpations: Abdomen is soft.  Musculoskeletal:        General: Swelling (of fingers on L hand w/ some stiffness) present.     Cervical back: Normal range of motion and neck supple.     Right lower leg: No edema.     Left lower leg: No edema.  Lymphadenopathy:     Cervical: No cervical adenopathy.  Skin:    General: Skin is warm and dry.  Neurological:     General: No focal deficit present.     Mental Status: He is alert and oriented to person, place, and time.     Cranial Nerves: No cranial nerve deficit.  Psychiatric:        Mood and Affect: Mood normal.        Behavior: Behavior normal.           Assessment & Plan:

## 2022-03-23 ENCOUNTER — Telehealth: Payer: Self-pay

## 2022-03-23 ENCOUNTER — Other Ambulatory Visit: Payer: Self-pay

## 2022-03-23 DIAGNOSIS — R944 Abnormal results of kidney function studies: Secondary | ICD-10-CM

## 2022-03-23 NOTE — Assessment & Plan Note (Signed)
New to provider, ongoing for pt.  He reports taking ibuprofen as needed but having steady pain all the time.  Pain is most notable when gripping objects.  Will hold Ibuprofen and start daily Celebrex to try and decrease risk of GI upset or renal issues going forward.  Pt expressed understanding and is in agreement w/ plan.

## 2022-03-23 NOTE — Assessment & Plan Note (Signed)
Ongoing issue for pt.  He did not like how Trazodone made him feel back in 2020.  He is taking OTC sleep aids w/ some relief at this time and is hesitant to switch to a prescription medication.  Discussed sleep hygiene and the importance of routine.  Will follow.

## 2022-03-23 NOTE — Assessment & Plan Note (Signed)
New to provider.  Pt reports 3-4 episodes in the last 6 months that have taken a week or so to resolve.  He feels he can directly relate each episode to his diet.  He is not interested in taking a daily medication but would like to have meds on hand for flares.  Will check UA level and possibly revisit the idea of Allopurinol if level is high.  Pt expressed understanding and is in agreement w/ plan.

## 2022-03-23 NOTE — Assessment & Plan Note (Signed)
New.  Reviewed dx w/ pt and encouraged him to avoid wearing his large knife on that side and adjusting his belt as needed to take pressure off the nerve.  Pt expressed understanding and is in agreement w/ plan.

## 2022-03-23 NOTE — Telephone Encounter (Signed)
-----   Message from Midge Minium, MD sent at 03/23/2022  7:30 AM EST ----- Your uric acid level is elevated which supports the gout flares that you have been having.  Based on this, it would be reasonable to start Allopurinol to lower your uric acid level and decrease risk of attacks.  It is best to start this when you are having an attack so please message the next time this occurs.  Your serum creatinine- a marker of kidney function- is elevated.  When this is elevated, it means your GFR (another marker of kidney function) is low.  Please increase your water intake, hold any ibuprofen, motrin, aleve, BC's, Goody's, etc and we will have you come back in 2 weeks for a lab only visit to repeat your BMP (dx decreased GFR)

## 2022-03-23 NOTE — Assessment & Plan Note (Signed)
New.  Likely tinnitus.  Discussed dx and supportive measures as there is no cure.  If sxs change or worsen, he is to let me know so we can refer to ENT for a complete evaluation.  Pt expressed understanding and is in agreement w/ plan.

## 2022-03-23 NOTE — Telephone Encounter (Signed)
Informed pt of lab results and lab only visit has been made . Repeat BMP order is in

## 2022-04-05 ENCOUNTER — Telehealth: Payer: Self-pay | Admitting: Family Medicine

## 2022-04-05 ENCOUNTER — Other Ambulatory Visit (INDEPENDENT_AMBULATORY_CARE_PROVIDER_SITE_OTHER): Payer: Medicaid Other

## 2022-04-05 DIAGNOSIS — R944 Abnormal results of kidney function studies: Secondary | ICD-10-CM | POA: Diagnosis not present

## 2022-04-05 LAB — BASIC METABOLIC PANEL
BUN: 26 mg/dL — ABNORMAL HIGH (ref 6–23)
CO2: 25 mEq/L (ref 19–32)
Calcium: 9.3 mg/dL (ref 8.4–10.5)
Chloride: 103 mEq/L (ref 96–112)
Creatinine, Ser: 1.93 mg/dL — ABNORMAL HIGH (ref 0.40–1.50)
GFR: 37.34 mL/min — ABNORMAL LOW (ref >60.00)
Glucose, Bld: 101 mg/dL — ABNORMAL HIGH (ref 70–99)
Potassium: 4.8 mEq/L (ref 3.5–5.1)
Sodium: 138 mEq/L (ref 135–145)

## 2022-04-05 NOTE — Telephone Encounter (Signed)
Caller name: PRATHAM BEISSEL  On DPR?: Yes  Call back number: [phone] (mobile)  Provider they see: Midge Minium, MD  Reason for call:  Patient stated he is still having  numbness in his left leg. Patient wants to know if can do anything different about this. Patient is aware that he may need another appt for his concerns.

## 2022-04-05 NOTE — Telephone Encounter (Signed)
Spoke to the pt and he states he stands in one spot at work for quite some time when he moves his legs hurt . He states the pain on the left side is not any better but getting worse . He states he has loosens his belt . Moved his wallet and keys to the right pocket . He has done everything that was discussed at his last visit and he is wanting to know if he should come back in or what else can he do ?

## 2022-04-06 ENCOUNTER — Telehealth: Payer: Self-pay

## 2022-04-06 ENCOUNTER — Other Ambulatory Visit: Payer: Self-pay

## 2022-04-06 DIAGNOSIS — M79605 Pain in left leg: Secondary | ICD-10-CM

## 2022-04-06 DIAGNOSIS — E1022 Type 1 diabetes mellitus with diabetic chronic kidney disease: Secondary | ICD-10-CM

## 2022-04-06 DIAGNOSIS — R944 Abnormal results of kidney function studies: Secondary | ICD-10-CM

## 2022-04-06 DIAGNOSIS — R200 Anesthesia of skin: Secondary | ICD-10-CM

## 2022-04-06 NOTE — Telephone Encounter (Signed)
Left pt a VM stating we are referring him to Sports Medicine . Referral has been placed

## 2022-04-06 NOTE — Telephone Encounter (Signed)
Informed pt of lab results . BMP order is in and lab only visit has been scheduled.  Kidney specialist referral has been placed

## 2022-04-06 NOTE — Telephone Encounter (Signed)
Ok to refer to Sports Med for a complete evaluation- dx L leg pain and numbness

## 2022-04-06 NOTE — Telephone Encounter (Signed)
-----   Message from Midge Minium, MD sent at 04/06/2022  7:33 AM EDT ----- Kidney function (Creatinine and GFR) are worse than last check.  Please hold the Celebrex, any ibuprofen/aleve/motrin/excedrin/etc and increase your water intake.  We will repeat your BMP at a lab only visit in 1 week and refer to a kidney specialist for complete evaluation (Dx-CKD stage 3)

## 2022-04-12 ENCOUNTER — Other Ambulatory Visit (INDEPENDENT_AMBULATORY_CARE_PROVIDER_SITE_OTHER): Payer: Medicaid Other

## 2022-04-12 ENCOUNTER — Other Ambulatory Visit: Payer: Self-pay

## 2022-04-12 DIAGNOSIS — R944 Abnormal results of kidney function studies: Secondary | ICD-10-CM

## 2022-04-13 LAB — BASIC METABOLIC PANEL
BUN: 25 mg/dL — ABNORMAL HIGH (ref 6–23)
CO2: 27 mEq/L (ref 19–32)
Calcium: 9 mg/dL (ref 8.4–10.5)
Chloride: 104 mEq/L (ref 96–112)
Creatinine, Ser: 1.83 mg/dL — ABNORMAL HIGH (ref 0.40–1.50)
GFR: 39.79 mL/min — ABNORMAL LOW (ref >60.00)
Glucose, Bld: 121 mg/dL — ABNORMAL HIGH (ref 70–99)
Potassium: 4.4 mEq/L (ref 3.5–5.1)
Sodium: 138 mEq/L (ref 135–145)

## 2022-04-18 ENCOUNTER — Telehealth: Payer: Self-pay

## 2022-04-18 NOTE — Telephone Encounter (Signed)
Left pt a vm w/ lab results  

## 2022-04-18 NOTE — Telephone Encounter (Signed)
-----   Message from Midge Minium, MD sent at 04/17/2022  7:45 PM EDT ----- Creatinine (kidney function) shows slight improvement.  Make sure you are holding off on ibuprofen, celecoxib, motrin, Aleve, etc.  You can take Tylenol to help w/ pain.  Make sure you are drinking lots of water.  We will wait and see what the kidney specialists have to say

## 2022-04-21 NOTE — Progress Notes (Deleted)
    Aleen Sells D.[facility] [address] [place] [address] Phone: [phone]   Assessment and Plan:     There are no diagnoses linked to this encounter.  ***   Pertinent previous records reviewed include ***   Follow Up: ***     Subjective:   I, Cherye Gaertner, am serving as a Neurosurgeon for Doctor Richardean Sale  Chief Complaint: left lower body pain   HPI:   04/24/2022 Patient is a 62 year old male complaining of left lower body pain. Patient states  Relevant Historical Information: ***  Additional pertinent review of systems negative.   Current Outpatient Medications:    celecoxib (CELEBREX) 100 MG capsule, Take 1 capsule (100 mg total) by mouth daily., Disp: 90 capsule, Rfl: 1   colchicine 0.6 MG tablet, Take 2 tabs at first sign of gout followed by 1 tab an hour later, Disp: 30 tablet, Rfl: 0   omeprazole (PRILOSEC) 20 MG capsule, Take 20 mg by mouth daily., Disp: , Rfl:    Objective:     There were no vitals filed for this visit.    There is no height or weight on file to calculate BMI.    Physical Exam:    ***   Electronically signed by:  Aleen Sells D.[facility] 7:23 AM 04/21/22

## 2022-04-24 ENCOUNTER — Ambulatory Visit: Payer: Medicaid Other | Admitting: Sports Medicine

## 2022-05-15 DIAGNOSIS — F17200 Nicotine dependence, unspecified, uncomplicated: Secondary | ICD-10-CM | POA: Diagnosis not present

## 2022-05-15 DIAGNOSIS — K219 Gastro-esophageal reflux disease without esophagitis: Secondary | ICD-10-CM | POA: Diagnosis not present

## 2022-05-15 DIAGNOSIS — M109 Gout, unspecified: Secondary | ICD-10-CM | POA: Diagnosis not present

## 2022-05-15 DIAGNOSIS — N1832 Chronic kidney disease, stage 3b: Secondary | ICD-10-CM | POA: Diagnosis not present

## 2022-05-15 DIAGNOSIS — R799 Abnormal finding of blood chemistry, unspecified: Secondary | ICD-10-CM | POA: Diagnosis not present

## 2022-05-16 ENCOUNTER — Other Ambulatory Visit: Payer: Self-pay | Admitting: Internal Medicine

## 2022-05-16 DIAGNOSIS — N1832 Chronic kidney disease, stage 3b: Secondary | ICD-10-CM

## 2022-05-24 ENCOUNTER — Telehealth: Payer: Self-pay

## 2022-05-24 NOTE — Telephone Encounter (Signed)
Pt reports he is having a gout flare and is requesting for what he needs to do for treatment.   Call back at [phone]

## 2022-05-24 NOTE — Telephone Encounter (Signed)
I have spoken to the pt and advised he needs an apt . We have him coming tomorrow to see Dr Beverely Low

## 2022-05-24 NOTE — Telephone Encounter (Signed)
Needs an appt to be seen especially since his kidney function was decreased.  We need to be careful what we prescribe in this situation

## 2022-05-25 ENCOUNTER — Ambulatory Visit: Payer: Medicaid Other | Admitting: Family Medicine

## 2022-05-25 ENCOUNTER — Encounter: Payer: Self-pay | Admitting: Family Medicine

## 2022-05-25 VITALS — BP 132/80 | HR 76 | Temp 97.8°F | Resp 16 | Ht 68.0 in | Wt 150.4 lb

## 2022-05-25 DIAGNOSIS — M1A3720 Chronic gout due to renal impairment, left ankle and foot, without tophus (tophi): Secondary | ICD-10-CM | POA: Diagnosis not present

## 2022-05-25 DIAGNOSIS — N183 Chronic kidney disease, stage 3 unspecified: Secondary | ICD-10-CM

## 2022-05-25 MED ORDER — PREDNISONE 10 MG PO TABS
ORAL_TABLET | ORAL | 0 refills | Status: DC
Start: 2022-05-25 — End: 2022-09-22

## 2022-05-25 MED ORDER — ALLOPURINOL 100 MG PO TABS: 100.0000 mg | ORAL_TABLET | Freq: Every day | ORAL | 1 refills | Status: DC

## 2022-05-25 NOTE — Progress Notes (Signed)
   Subjective:    Patient ID: Wayne Carpenter, male    DOB: 1960-02-26, 62 y.o.   MRN: [id number]  HPI Gout- L great toe.  Sxs started 2 days ago.  Area is red, swollen, warm to touch.  Consistent w/ previous gout flares.  Current Cr 1.66 (from Renal)   Review of Systems For ROS see HPI     Objective:   Physical Exam Vitals reviewed.  Constitutional:      General: He is not in acute distress.    Appearance: Normal appearance. He is not ill-appearing.  Cardiovascular:     Pulses: Normal pulses.  Musculoskeletal:        General: Swelling (L great toe) and tenderness (L great toe) present.  Skin:    General: Skin is warm and dry.     Findings: Erythema (L great toe) present.  Neurological:     General: No focal deficit present.     Mental Status: He is alert and oriented to person, place, and time.  Psychiatric:        Mood and Affect: Mood normal.        Behavior: Behavior normal.        Thought Content: Thought content normal.           Assessment & Plan:

## 2022-05-25 NOTE — Patient Instructions (Addendum)
Follow up as needed or as scheduled START the Prednisone as directed- 3 pills at the same time x3 days, then 2 pills at the same time x3 days, then 1 pill daily.  Take w/ food  ICE! Start the Allopurinol daily to prevent future episodes.  We will adjust this as needed Call with any questions or concerns Stay Safe!  Stay Healthy! Hang in there!!!

## 2022-05-30 NOTE — Assessment & Plan Note (Signed)
Deteriorated.  Current flare.  Start Allopurinol during acute flare and titrate as needed to prevent future flares.  Start Prednisone taper given pt's renal insufficiency and need to avoid NSAIDs.  Reviewed supportive care and red flags that should prompt return.  Pt expressed understanding and is in agreement w/ plan.

## 2022-06-08 ENCOUNTER — Other Ambulatory Visit: Payer: Medicaid Other

## 2022-07-04 ENCOUNTER — Ambulatory Visit
Admission: RE | Admit: 2022-07-04 | Discharge: 2022-07-04 | Disposition: A | Discharge status: Home / Self Care | Payer: Medicaid Other | Source: Ambulatory Visit | Attending: Internal Medicine | Admitting: Internal Medicine

## 2022-07-04 DIAGNOSIS — N189 Chronic kidney disease, unspecified: Secondary | ICD-10-CM | POA: Diagnosis not present

## 2022-07-04 DIAGNOSIS — N1832 Chronic kidney disease, stage 3b: Secondary | ICD-10-CM

## 2022-08-23 ENCOUNTER — Other Ambulatory Visit: Payer: Self-pay | Admitting: Family Medicine

## 2022-08-31 ENCOUNTER — Encounter (INDEPENDENT_AMBULATORY_CARE_PROVIDER_SITE_OTHER): Payer: Self-pay

## 2022-09-22 ENCOUNTER — Encounter: Payer: Self-pay | Admitting: Family Medicine

## 2022-09-22 ENCOUNTER — Ambulatory Visit (INDEPENDENT_AMBULATORY_CARE_PROVIDER_SITE_OTHER): Payer: Medicaid Other | Admitting: Family Medicine

## 2022-09-22 VITALS — BP 136/82 | HR 77 | Temp 97.9°F | Resp 18 | Ht 68.0 in | Wt 149.0 lb

## 2022-09-22 DIAGNOSIS — N1832 Chronic kidney disease, stage 3b: Secondary | ICD-10-CM | POA: Diagnosis not present

## 2022-09-22 DIAGNOSIS — Z0000 Encounter for general adult medical examination without abnormal findings: Secondary | ICD-10-CM | POA: Insufficient documentation

## 2022-09-22 DIAGNOSIS — Z1211 Encounter for screening for malignant neoplasm of colon: Secondary | ICD-10-CM

## 2022-09-22 LAB — HEPATIC FUNCTION PANEL
ALT: 25 U/L (ref 0–53)
AST: 28 U/L (ref 0–37)
Albumin: 4 g/dL (ref 3.5–5.2)
Alkaline Phosphatase: 69 U/L (ref 39–117)
Bilirubin, Direct: 0.1 mg/dL (ref 0.0–0.3)
Total Bilirubin: 0.6 mg/dL (ref 0.2–1.2)
Total Protein: 7.4 g/dL (ref 6.0–8.3)

## 2022-09-22 LAB — BASIC METABOLIC PANEL
BUN: 19 mg/dL (ref 6–23)
CO2: 28 meq/L (ref 19–32)
Calcium: 9.3 mg/dL (ref 8.4–10.5)
Chloride: 103 meq/L (ref 96–112)
Creatinine, Ser: 1.69 mg/dL — ABNORMAL HIGH (ref 0.40–1.50)
GFR: 43.64 mL/min — ABNORMAL LOW (ref >60.00)
Glucose, Bld: 87 mg/dL (ref 70–99)
Potassium: 4.1 mEq/L (ref 3.5–5.1)
Sodium: 139 meq/L (ref 135–145)

## 2022-09-22 LAB — CBC WITH DIFFERENTIAL/PLATELET
Basophils Absolute: 0 10*3/uL (ref 0.0–0.1)
Basophils Relative: 0.4 % (ref 0.0–3.0)
Eosinophils Absolute: 0.3 10*3/uL (ref 0.0–0.7)
Eosinophils Relative: 3.2 % (ref 0.0–5.0)
HCT: 42.4 % (ref 39.0–52.0)
Hemoglobin: 14.3 g/dL (ref 13.0–17.0)
Lymphocytes Relative: 18.2 % (ref 12.0–46.0)
Lymphs Abs: 1.8 10*3/uL (ref 0.7–4.0)
MCHC: 33.7 g/dL (ref 30.0–36.0)
MCV: 98.6 fl (ref 78.0–100.0)
Monocytes Absolute: 0.9 10*3/uL (ref 0.1–1.0)
Monocytes Relative: 9.7 % (ref 3.0–12.0)
Neutro Abs: 6.7 10*3/uL (ref 1.4–7.7)
Neutrophils Relative %: 68.5 % (ref 43.0–77.0)
Platelets: 198 10*3/uL (ref 150.0–400.0)
RBC: 4.3 Mil/uL (ref 4.22–5.81)
RDW: 13.5 % (ref 11.5–15.5)
WBC: 9.7 10*3/uL (ref 4.0–10.5)

## 2022-09-22 LAB — LIPID PANEL
Cholesterol: 183 mg/dL (ref 0–200)
HDL: 44.9 mg/dL (ref >39.00)
LDL Cholesterol: 66 mg/dL (ref 0–99)
NonHDL: 138.43
Total CHOL/HDL Ratio: 4
Triglycerides: 363 mg/dL — ABNORMAL HIGH (ref 0.0–149.0)
VLDL: 72.6 mg/dL — ABNORMAL HIGH (ref 0.0–40.0)

## 2022-09-22 LAB — HEMOGLOBIN A1C: Hgb A1c MFr Bld: 5.7 % (ref 4.6–6.5)

## 2022-09-22 LAB — TSH: TSH: 2.09 u[IU]/mL (ref 0.35–5.50)

## 2022-09-22 MED ORDER — ALLOPURINOL 100 MG PO TABS: 100.0000 mg | ORAL_TABLET | Freq: Every day | ORAL | 1 refills | Status: AC

## 2022-09-22 NOTE — Progress Notes (Signed)
   Subjective:    Patient ID: Wayne Carpenter, male    DOB: 1960/06/23, 62 y.o.   MRN: [id number]  HPI CPE- due for colonoscopy, prefers to do Cologuard.  Declines flu.  UTD on Tdap.  Patient Care Team    Relationship Specialty Notifications Start End  Sheliah Hatch, MD PCP - General Family Medicine  03/25/18      Health Maintenance  Topic Date Due   Colonoscopy  Never done   INFLUENZA VACCINE  Never done   DTaP/Tdap/Td (2 - Td or Tdap) 03/24/2028   HPV VACCINES  Aged Out   COVID-19 Vaccine  Discontinued   Hepatitis C Screening  Discontinued   HIV Screening  Discontinued   Zoster Vaccines- Shingrix  Discontinued      Review of Systems Patient reports no vision/hearing changes, anorexia, fever ,adenopathy, persistant/recurrent hoarseness, swallowing issues, chest pain, palpitations, edema, persistant/recurrent cough, hemoptysis, dyspnea (rest,exertional, paroxysmal nocturnal), gastrointestinal  bleeding (melena, rectal bleeding), abdominal pain, excessive heart burn, GU symptoms (dysuria, hematuria, voiding/incontinence issues) syncope, focal weakness, memory loss, numbness & tingling, skin/hair/nail changes, depression, anxiety, abnormal bruising/bleeding, musculoskeletal symptoms/signs.     Objective:   Physical Exam General Appearance:    Alert, cooperative, no distress, appears stated age  Head:    Normocephalic, without obvious abnormality, atraumatic  Eyes:    PERRL, conjunctiva/corneas clear, EOM's intact both eyes       Ears:    Normal TM's and external ear canals, both ears  Nose:   Nares normal, septum midline, mucosa normal, no drainage   or sinus tenderness  Throat:   Lips, mucosa, and tongue normal; teeth and gums normal  Neck:   Supple, symmetrical, trachea midline, no adenopathy;       thyroid:  No enlargement/tenderness/nodules  Back:     Symmetric, no curvature, ROM normal, no CVA tenderness  Lungs:     Clear to auscultation bilaterally, respirations  unlabored  Chest wall:    No tenderness or deformity  Heart:    Regular rate and rhythm, S1 and S2 normal, no murmur, rub   or gallop  Abdomen:     Soft, non-tender, bowel sounds active all four quadrants,    no masses, no organomegaly  Genitalia:    deferred  Rectal:    Extremities:   Extremities normal, atraumatic, no cyanosis or edema  Pulses:   2+ and symmetric all extremities  Skin:   Skin color, texture, turgor normal, no rashes or lesions  Lymph nodes:   Cervical, supraclavicular, and axillary nodes normal  Neurologic:   CNII-XII intact. Normal strength, sensation and reflexes      throughout          Assessment & Plan:

## 2022-09-22 NOTE — Assessment & Plan Note (Signed)
Now following w/ [facility].  Thought to be NSAID induced.  Pt has not taken any NSAIDs since last visit.

## 2022-09-22 NOTE — Patient Instructions (Signed)
Follow up in 1 year or as needed We'll notify you of your lab results and make any changes if needed Keep up the good work on healthy diet and regular exercise- you look great! Complete and return the Cologuard as directed Call with any questions or concerns Stay Safe!  Stay Healthy! Happy Fall!!

## 2022-09-22 NOTE — Assessment & Plan Note (Signed)
Pt's PE WNL.  Due for colon cancer screening- prefers cologuard.  UTD on Tdap.  Declines flu.  Check labs.  Anticipatory guidance provided.

## 2022-09-25 ENCOUNTER — Telehealth: Payer: Self-pay

## 2022-09-25 NOTE — Telephone Encounter (Signed)
-----   Message from Neena Rhymes sent at 09/25/2022  7:17 AM EDT ----- Your creatinine continues to trend down closer to normal range- this is great news!  Your Triglycerides (fatty part of blood) are elevated.  This will improve w/ a healthy diet and regular exercise.  Will hold off on medication at this time, but will continue to monitor.  Remainder of labs look great!

## 2022-09-25 NOTE — Telephone Encounter (Signed)
Pt seen results Via my chart  

## 2022-10-11 DIAGNOSIS — Z1211 Encounter for screening for malignant neoplasm of colon: Secondary | ICD-10-CM | POA: Diagnosis not present

## 2022-10-15 LAB — COLOGUARD: COLOGUARD: NEGATIVE

## 2022-10-16 ENCOUNTER — Telehealth: Payer: Self-pay

## 2022-10-16 NOTE — Telephone Encounter (Signed)
-----   Message from Neena Rhymes sent at 10/16/2022  7:16 AM EDT ----- Normal cologuard- great news!

## 2023-09-24 ENCOUNTER — Encounter: Payer: Medicaid Other | Admitting: Family Medicine
